# Patient Record
Sex: Female | Born: 1985 | Race: White | Hispanic: No | Marital: Single | Smoking: Never smoker
Health system: Southern US, Community
[De-identification: ages and names within clinical notes are randomized; demographics above are authoritative.]

## PROBLEM LIST (undated history)

## (undated) DIAGNOSIS — T7421XA Adult sexual abuse, confirmed, initial encounter: Secondary | ICD-10-CM

## (undated) DIAGNOSIS — F419 Anxiety disorder, unspecified: Secondary | ICD-10-CM

## (undated) HISTORY — DX: Adult sexual abuse, confirmed, initial encounter: T74.21XA

## (undated) HISTORY — PX: WISDOM TOOTH EXTRACTION: SHX21

## (undated) HISTORY — DX: Anxiety disorder, unspecified: F41.9

---

## 2005-02-26 ENCOUNTER — Emergency Department: Payer: Self-pay | Admitting: Emergency Medicine

## 2006-10-20 ENCOUNTER — Ambulatory Visit: Payer: Self-pay | Admitting: Internal Medicine

## 2015-04-29 ENCOUNTER — Encounter: Payer: Self-pay | Admitting: Emergency Medicine

## 2015-04-29 ENCOUNTER — Emergency Department
Admission: EM | Admit: 2015-04-29 | Discharge: 2015-04-29 | Disposition: A | Discharge status: Home / Self Care | Payer: BC Managed Care – PPO | Attending: Emergency Medicine | Admitting: Emergency Medicine

## 2015-04-29 ENCOUNTER — Emergency Department: Payer: BC Managed Care – PPO

## 2015-04-29 DIAGNOSIS — R1032 Left lower quadrant pain: Secondary | ICD-10-CM | POA: Diagnosis present

## 2015-04-29 DIAGNOSIS — R109 Unspecified abdominal pain: Secondary | ICD-10-CM

## 2015-04-29 LAB — COMPREHENSIVE METABOLIC PANEL
ALBUMIN: 4.3 g/dL (ref 3.5–5.0)
ALT: 20 U/L (ref 14–54)
AST: 23 U/L (ref 15–41)
Alkaline Phosphatase: 64 U/L (ref 38–126)
Anion gap: 10 (ref 5–15)
BUN: 9 mg/dL (ref 6–20)
CHLORIDE: 102 mmol/L (ref 101–111)
CO2: 23 mmol/L (ref 22–32)
Calcium: 9.2 mg/dL (ref 8.9–10.3)
Creatinine, Ser: 0.75 mg/dL (ref 0.44–1.00)
GFR calc Af Amer: >60 mL/min (ref >60)
GFR calc non Af Amer: >60 mL/min (ref >60)
GLUCOSE: 95 mg/dL (ref 65–99)
POTASSIUM: 3.9 mmol/L (ref 3.5–5.1)
Sodium: 135 mmol/L (ref 135–145)
Total Bilirubin: 0.6 mg/dL (ref 0.3–1.2)
Total Protein: 8.2 g/dL — ABNORMAL HIGH (ref 6.5–8.1)

## 2015-04-29 LAB — URINALYSIS COMPLETE WITH MICROSCOPIC (ARMC ONLY)
Bilirubin Urine: NEGATIVE
Glucose, UA: NEGATIVE mg/dL
Hgb urine dipstick: NEGATIVE
Leukocytes, UA: NEGATIVE
Nitrite: NEGATIVE
Protein, ur: NEGATIVE mg/dL
Specific Gravity, Urine: 1.017 (ref 1.005–1.030)
pH: 6 (ref 5.0–8.0)

## 2015-04-29 LAB — CBC
HEMATOCRIT: 40.8 % (ref 35.0–47.0)
Hemoglobin: 14.1 g/dL (ref 12.0–16.0)
MCH: 28.4 pg (ref 26.0–34.0)
MCHC: 34.5 g/dL (ref 32.0–36.0)
MCV: 82.3 fL (ref 80.0–100.0)
Platelets: 352 10*3/uL (ref 150–440)
RBC: 4.96 MIL/uL (ref 3.80–5.20)
RDW: 12.6 % (ref 11.5–14.5)
WBC: 12.2 10*3/uL — ABNORMAL HIGH (ref 3.6–11.0)

## 2015-04-29 LAB — LIPASE, BLOOD: Lipase: 17 U/L (ref 11–51)

## 2015-04-29 LAB — POCT PREGNANCY, URINE: Preg Test, Ur: NEGATIVE

## 2015-04-29 MED ORDER — DICYCLOMINE HCL 20 MG PO TABS: 20.0000 mg | ORAL_TABLET | Freq: Three times a day (TID) | ORAL | Status: DC | PRN

## 2015-04-29 NOTE — Discharge Instructions (Signed)
Please seek medical attention for any high fevers, chest pain, shortness of breath, change in behavior, persistent vomiting, bloody stool or any other new or concerning symptoms. ° ° °Abdominal Pain, Adult °Many things can cause belly (abdominal) pain. Most times, the belly pain is not dangerous. Many cases of belly pain can be watched and treated at home. °HOME CARE  °· Do not take medicines that help you go poop (laxatives) unless told to by your doctor. °· Only take medicine as told by your doctor. °· Eat or drink as told by your doctor. Your doctor will tell you if you should be on a special diet. °GET HELP IF: °· You do not know what is causing your belly pain. °· You have belly pain while you are sick to your stomach (nauseous) or have runny poop (diarrhea). °· You have pain while you pee or poop. °· Your belly pain wakes you up at night. °· You have belly pain that gets worse or better when you eat. °· You have belly pain that gets worse when you eat fatty foods. °· You have a fever. °GET HELP RIGHT AWAY IF:  °· The pain does not go away within 2 hours. °· You keep throwing up (vomiting). °· The pain changes and is only in the right or left part of the belly. °· You have bloody or tarry looking poop. °MAKE SURE YOU:  °· Understand these instructions. °· Will watch your condition. °· Will get help right away if you are not doing well or get worse. °  °This information is not intended to replace advice given to you by your health care provider. Make sure you discuss any questions you have with your health care provider. °  °Document Released: 06/29/2007 Document Revised: 01/31/2014 Document Reviewed: 09/19/2012 °Elsevier Interactive Patient Education ©2016 [employer] ° °

## 2015-04-29 NOTE — ED Notes (Signed)
Pt from home with lower left quadrant pain. States she was seen at walk-in clinic yesterday and was told she had constipation. They informed her this morning that she had a UTI and they prescribed macrobid for it. Pt states she has had nausea and vomiting since yesterday. Pt is tearful in triage.

## 2015-04-29 NOTE — ED Provider Notes (Signed)
**Note Karen-Identified via Obfuscation** [facility] Emergency Department Provider Note    ____________________________________________  Time seen: ~1640  I have reviewed the triage vital signs and the nursing notes.   HISTORY  Chief Complaint Abdominal Pain   History limited by: Not Limited   HPI Karen Mata is a 30 y.o. female who presents to the emergency department today because of concerns for abdominal pain. She states this been going on for the past 2-3 days. It is located in the left side of her abdomen. She describes it as cramping. It does become severe. She states she was seen at urgent care yesterday and was told she had a UTI. She did start on Macrobid today. She has had some nausea. Has had some vomiting. No bloody stool. No measured fevers. 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    History reviewed. No pertinent past medical history.  There are no active problems to display for this patient.   History reviewed. No pertinent past surgical history.  No current outpatient prescriptions on file.  Allergies Review of patient's allergies indicates not on file.  History reviewed. No pertinent family history.  Social History Social History  Substance Use Topics  . Smoking status: Never Smoker   . Smokeless tobacco: None  . Alcohol Use: Yes     Comment: occasional    Review of Systems  Constitutional: Negative for fever. Cardiovascular: Negative for chest pain. Respiratory: Negative for shortness of breath. Gastrointestinal: Positive for left sided abdominal pain.  Neurological: Negative for headaches, focal weakness or numbness.  10-point ROS otherwise negative.  ____________________________________________   PHYSICAL EXAM:  VITAL SIGNS: ED Triage Vitals  Enc Vitals Group     BP 04/29/15 1410 129/109 mmHg     Pulse Rate 04/29/15 1410 106     Resp 04/29/15 1410 20     Temp 04/29/15 1410 98.2 F (36.8 C)     Temp Source  04/29/15 1410 Oral     SpO2 04/29/15 1410 97 %     Weight 04/29/15 1410 280 lb (127.007 kg)     Height 04/29/15 1410  (1.651 m)     Head Cir --      Peak Flow --      Pain Score 04/29/15 1411 8   Constitutional: Alert and oriented. Well appearing and in no distress. Eyes: Conjunctivae are normal. PERRL. Normal extraocular movements. ENT   Head: Normocephalic and atraumatic.   Nose: No congestion/rhinnorhea.   Mouth/Throat: Mucous membranes are moist.   Neck: No stridor. Hematological/Lymphatic/Immunilogical: No cervical lymphadenopathy. Cardiovascular: Normal rate, regular rhythm.  No murmurs, rubs, or gallops. Respiratory: Normal respiratory effort without tachypnea nor retractions. Breath sounds are clear and equal bilaterally. No wheezes/rales/rhonchi. Gastrointestinal: Soft and nontender. No distention.  Genitourinary: Deferred Musculoskeletal: Normal range of motion in all extremities. No joint effusions.  No lower extremity tenderness nor edema. Neurologic:  Normal speech and language. No gross focal neurologic deficits are appreciated.  Skin:  Skin is warm, dry and intact. No rash noted. Psychiatric: Mood and affect are normal. Speech and behavior are normal. Patient exhibits appropriate insight and judgment.  ____________________________________________    LABS (pertinent positives/negatives)  Labs Reviewed  COMPREHENSIVE METABOLIC PANEL - Abnormal; Notable for the following:    Total Protein 8.2 (*)    All other components within normal limits  CBC - Abnormal; Notable for the following:    WBC 12.2 (*)    All other components within normal  limits  URINALYSIS COMPLETEWITH MICROSCOPIC ([facility] ONLY) - Abnormal; Notable for the following:    Color, Urine YELLOW (*)    APPearance CLEAR (*)    Ketones, ur 1+ (*)    Bacteria, UA RARE (*)    Squamous Epithelial / LPF 0-5 (*)    All other components within normal limits  LIPASE, BLOOD  POCT PREGNANCY,  URINE     ____________________________________________   EKG  None  ____________________________________________    RADIOLOGY  Abd x-ray IMPRESSION: No evidence suggest significant constipation. Possible colon wall thickening. Consider CT abdomen/pelvis.  ____________________________________________   PROCEDURES  Procedure(s) performed: None  Critical Care performed: No  ____________________________________________   INITIAL IMPRESSION / ASSESSMENT AND PLAN / ED COURSE  Pertinent labs & imaging results that were available during my care of the patient were reviewed by me and considered in my medical decision making (see chart for details).  Patient presented to the emergency department today because of concerns for left-sided abdominal pain. Abdominal exam is benign. Soft. Abdominal x-ray shows potential for some colon wall thickening. Think likely patient suffering from a viral gastroenteritis. At this point given lack of fever or significant leukocytosis or pain will defer CT scan. I did discuss return precautions with the patient. Will discharge with Bentyl prescription to help with abdominal cramping.  ____________________________________________   FINAL CLINICAL IMPRESSION(S) / ED DIAGNOSES  Final diagnoses:  Left sided abdominal pain     Phineas SemenGraydon Klaira Pesci, MD 04/29/15 1753

## 2015-07-22 LAB — HM PAP SMEAR: HM Pap smear: NEGATIVE

## 2017-07-31 IMAGING — CR DG ABDOMEN 2V
3 series · 3 of 3 positions shown · non-contrast
Comparison: None.

CLINICAL DATA: Left lower quadrant pain and constipation with
vomiting

EXAM:
ABDOMEN - 2 VIEW

[abdomen erect]
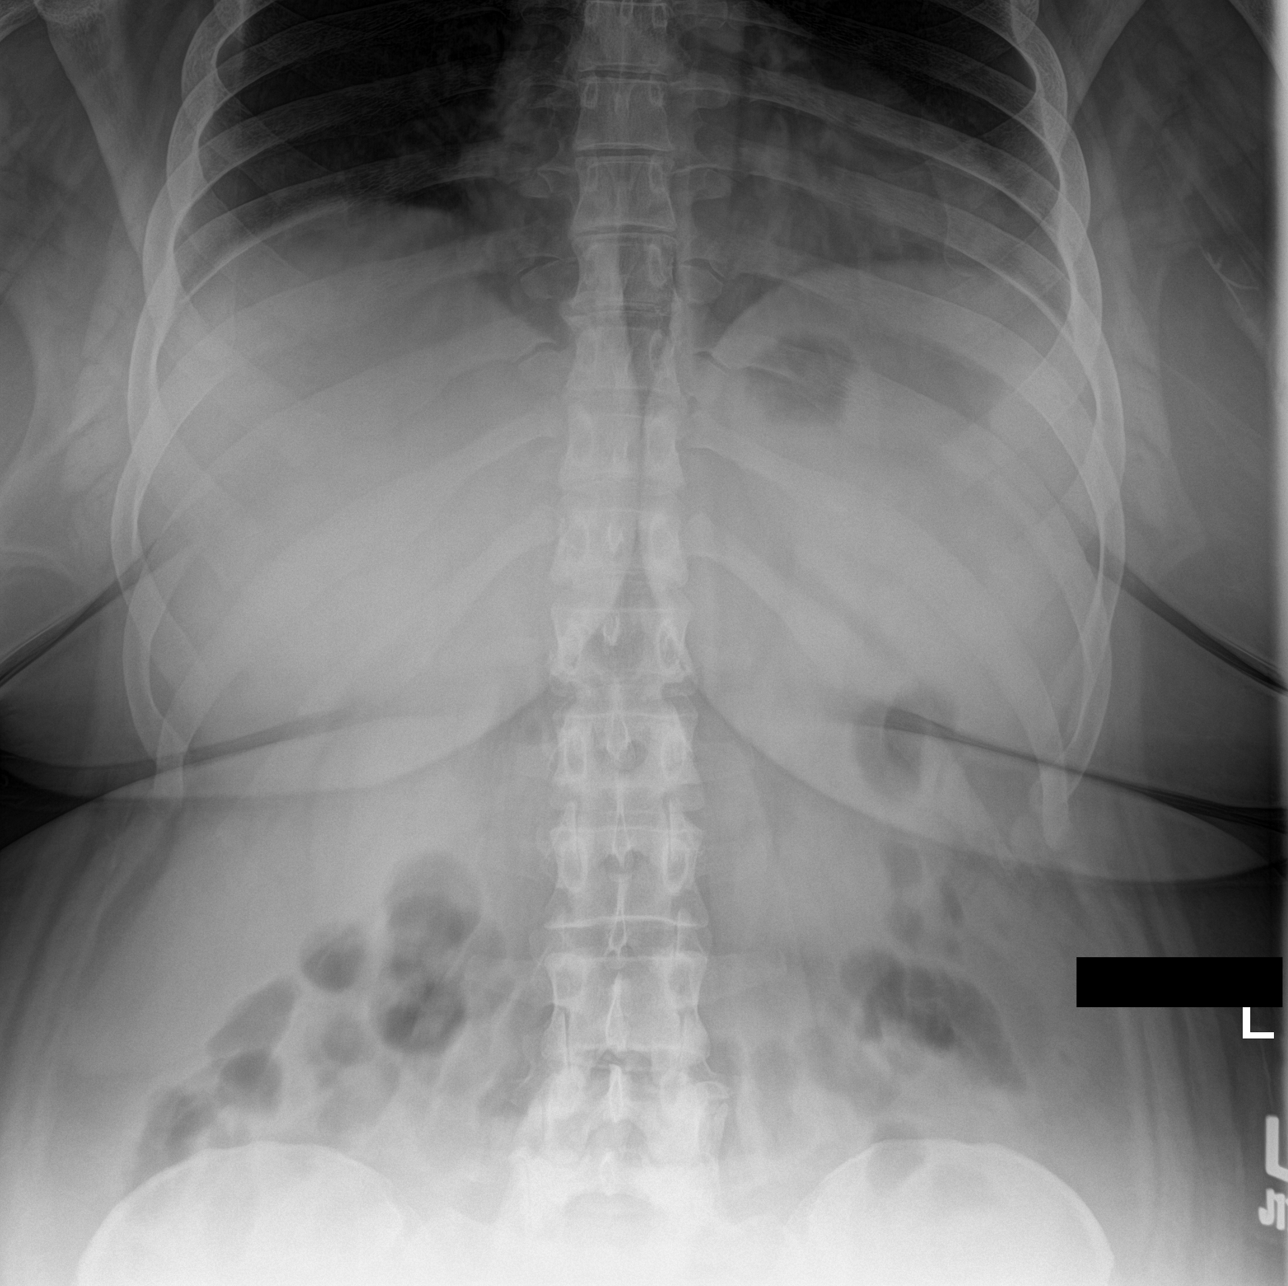

[abdomen supine (1 of 2)]
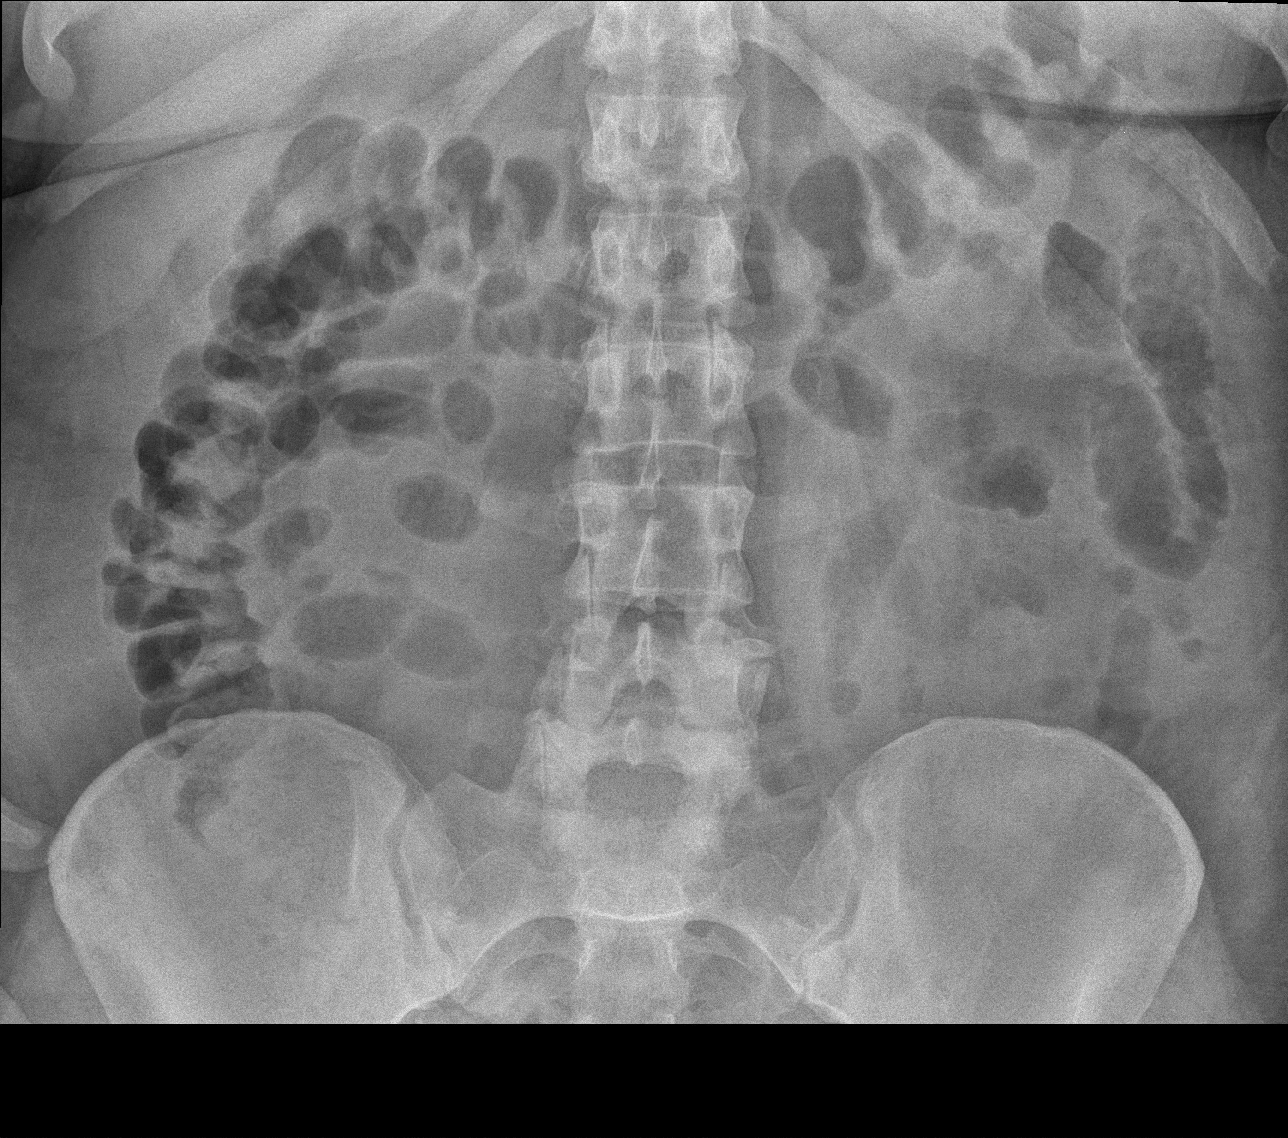

[abdomen supine (2 of 2)]
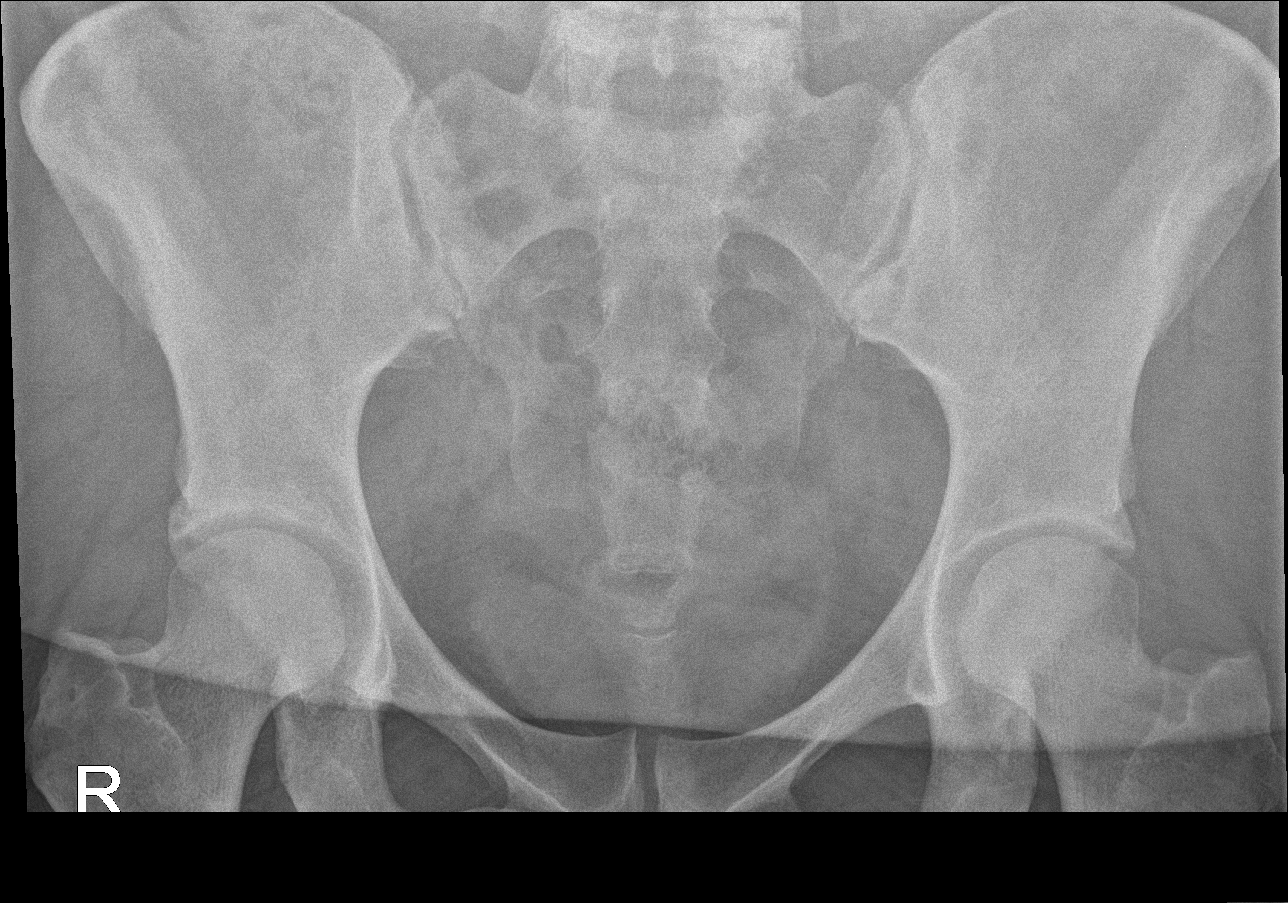

[3 of 3 positions shown; findings below may reference images not displayed]

FINDINGS: No free air. No abnormally dilated loops of bowel. Mild fecal
retention in the proximal colon. Mildly prominent haustration of the
ascending and transverse colon.
IMPRESSION: No evidence suggest significant constipation. Possible colon wall
thickening. Consider CT abdomen/pelvis.

## 2018-07-04 ENCOUNTER — Other Ambulatory Visit: Payer: Self-pay

## 2018-07-04 ENCOUNTER — Ambulatory Visit: Payer: BC Managed Care – PPO | Admitting: Obstetrics and Gynecology

## 2018-07-04 ENCOUNTER — Encounter: Payer: Self-pay | Admitting: Obstetrics and Gynecology

## 2018-07-04 VITALS — BP 126/82 | HR 112 | Ht 65.0 in | Wt 337.0 lb

## 2018-07-04 DIAGNOSIS — B9689 Other specified bacterial agents as the cause of diseases classified elsewhere: Secondary | ICD-10-CM

## 2018-07-04 DIAGNOSIS — Z30011 Encounter for initial prescription of contraceptive pills: Secondary | ICD-10-CM | POA: Diagnosis not present

## 2018-07-04 DIAGNOSIS — R300 Dysuria: Secondary | ICD-10-CM | POA: Diagnosis not present

## 2018-07-04 DIAGNOSIS — N760 Acute vaginitis: Secondary | ICD-10-CM | POA: Diagnosis not present

## 2018-07-04 DIAGNOSIS — N939 Abnormal uterine and vaginal bleeding, unspecified: Secondary | ICD-10-CM

## 2018-07-04 LAB — POCT WET PREP WITH KOH
Clue Cells Wet Prep HPF POC: POSITIVE
KOH Prep POC: POSITIVE — AB
Trichomonas, UA: NEGATIVE
Yeast Wet Prep HPF POC: NEGATIVE

## 2018-07-04 MED ORDER — NORETHINDRONE ACET-ETHINYL EST 1-20 MG-MCG PO TABS: 1.0000 | ORAL_TABLET | Freq: Every day | ORAL | 3 refills | Status: DC

## 2018-07-04 MED ORDER — METRONIDAZOLE 500 MG PO TABS: 500.0000 mg | ORAL_TABLET | Freq: Two times a day (BID) | ORAL | 0 refills | Status: AC
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
## 2018-07-04 NOTE — Patient Instructions (Signed)
I value your feedback and entrusting us with your care. If you get a [facility] patient survey, I would appreciate you taking the time to let us know about your experience today. Thank you! 

## 2018-07-23 ENCOUNTER — Telehealth: Payer: Self-pay

## 2018-07-23 NOTE — Telephone Encounter (Signed)
Pt calling; still having bleeding - little to a lot depending on the day.  Does she need to be seen again or what to do?  [phone]

## 2018-07-23 NOTE — Telephone Encounter (Signed)
Did she start her OCPs less than a month ago (after being seen)? Irreg bleeding is normal with BC start. If so, wait till starts 2nd pack. If still bleeding frequently, pt needs u/s and labs. If bleeding improves, then need to give it 3 months for cycle control. RN to discuss with pt.

## 2018-07-23 NOTE — Telephone Encounter (Signed)
Please advise 

## 2018-07-23 NOTE — Telephone Encounter (Signed)
Pt started OCP's Sunday after she saw you. Says blood clots are pretty big. PT aware of your notes.

## 2018-10-25 ENCOUNTER — Telehealth: Payer: Self-pay

## 2018-10-25 NOTE — Telephone Encounter (Signed)
Pt calling for rx for bc she was given 78m ago; it is working for her.  [facility].  [phone]

## 2018-10-26 NOTE — Telephone Encounter (Signed)
Glad she is doing well with pills. Rx eRxd for 1 yr at 6/20 appt so she should have RF. THx

## 2018-10-26 NOTE — Telephone Encounter (Signed)
Pt aware, will let us know if she has any issues.

## 2018-10-29 ENCOUNTER — Other Ambulatory Visit: Payer: Self-pay

## 2018-10-29 ENCOUNTER — Other Ambulatory Visit: Payer: Self-pay | Admitting: Obstetrics and Gynecology

## 2018-10-29 DIAGNOSIS — Z30011 Encounter for initial prescription of contraceptive pills: Secondary | ICD-10-CM

## 2018-10-29 DIAGNOSIS — N939 Abnormal uterine and vaginal bleeding, unspecified: Secondary | ICD-10-CM

## 2018-10-29 MED ORDER — NORETHINDRONE ACET-ETHINYL EST 1-20 MG-MCG PO TABS: 1.0000 | ORAL_TABLET | Freq: Every day | ORAL | 3 refills | Status: DC

## 2018-10-29 NOTE — Telephone Encounter (Signed)
BC RF sent, pt aware. 

## 2018-10-29 NOTE — Telephone Encounter (Signed)
Patient is calling saying her prescription for Birth Control is not at her Pharmacy. Currently out of prescription. Please contact [facility] in [place]. Please contact patient.

## 2019-01-12 ENCOUNTER — Other Ambulatory Visit: Payer: Self-pay | Admitting: Obstetrics and Gynecology

## 2019-01-12 DIAGNOSIS — N939 Abnormal uterine and vaginal bleeding, unspecified: Secondary | ICD-10-CM

## 2019-01-12 DIAGNOSIS — Z30011 Encounter for initial prescription of contraceptive pills: Secondary | ICD-10-CM

## 2019-05-11 ENCOUNTER — Other Ambulatory Visit: Payer: Self-pay | Admitting: Obstetrics and Gynecology

## 2019-05-11 DIAGNOSIS — Z30011 Encounter for initial prescription of contraceptive pills: Secondary | ICD-10-CM

## 2019-05-11 DIAGNOSIS — N939 Abnormal uterine and vaginal bleeding, unspecified: Secondary | ICD-10-CM

## 2019-09-15 ENCOUNTER — Other Ambulatory Visit: Payer: Self-pay | Admitting: Obstetrics and Gynecology

## 2019-09-15 DIAGNOSIS — Z30011 Encounter for initial prescription of contraceptive pills: Secondary | ICD-10-CM

## 2019-09-15 DIAGNOSIS — N939 Abnormal uterine and vaginal bleeding, unspecified: Secondary | ICD-10-CM

## 2019-09-19 ENCOUNTER — Telehealth: Payer: Self-pay

## 2019-09-19 NOTE — Telephone Encounter (Signed)
ok 

## 2019-09-19 NOTE — Telephone Encounter (Signed)
Copied from CRM [id number]. Topic: Appointment Scheduling - New Patient >> Sep 19, 2019 10:59 AM Wyonia Hough E wrote: Reason for CRM: Pts father(James Gulledge) is a pt of Dr. Wonda Olds He asked if Dr. Sullivan Lone will approve of taking his daughter as a new Pt/ please advise and call the daughter or Mr Yott  New patient would like to be scheduled for your office. Provider: Sullivan Lone  Date of Appointment:   Route to department's PEC pool.

## 2019-09-20 ENCOUNTER — Other Ambulatory Visit: Payer: Self-pay | Admitting: Obstetrics and Gynecology

## 2019-09-20 DIAGNOSIS — N939 Abnormal uterine and vaginal bleeding, unspecified: Secondary | ICD-10-CM

## 2019-09-20 DIAGNOSIS — Z30011 Encounter for initial prescription of contraceptive pills: Secondary | ICD-10-CM

## 2019-09-20 NOTE — Telephone Encounter (Signed)
Patient scheduled for September 14th at 3:20 PM.

## 2019-09-20 NOTE — Telephone Encounter (Signed)
Pt called back, and advised OK per Dr Sullivan Lone to be new pt.  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  Pt had covid a few weeks ago, and wanted to follow up with a dr for ongoing symptoms, ie: anxiety

## 2019-09-20 NOTE — Telephone Encounter (Signed)
Called to advise patient that Dr. Sullivan Lone says that he will take her as a new patient. LVMTCB to schedule for possibly September 14th.

## 2019-09-20 NOTE — Telephone Encounter (Signed)
Patient requesting refill on bc, completely out, annual scheduled 10/6 with [facility].  [facility].

## 2019-10-03 ENCOUNTER — Telehealth: Payer: Self-pay

## 2019-10-03 NOTE — Telephone Encounter (Signed)
According to my schedule she has an appointment next week with me on the 14th.

## 2019-10-03 NOTE — Telephone Encounter (Signed)
Copied from CRM #[id number]. Topic: Appointment Scheduling - New Patient °>> Sep 19, 2019 10:59 AM Johnson, Chaz E wrote: °Reason for CRM: Pts father(James Fero) is a pt of Dr. Gilberts/ He asked if Dr. Gilbert will approve of taking his daughter as a new Pt/ please advise and call the daughter or Mr Maddalena  °New patient would like to be scheduled for your office. °Provider: Gilbert  °Date of Appointment:  ° °Route to department's PEC pool. °
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
## 2019-10-08 ENCOUNTER — Ambulatory Visit: Payer: BC Managed Care – PPO | Admitting: Family Medicine

## 2019-10-08 ENCOUNTER — Other Ambulatory Visit: Payer: Self-pay

## 2019-10-08 ENCOUNTER — Encounter: Payer: Self-pay | Admitting: Family Medicine

## 2019-10-08 VITALS — BP 113/74 | HR 94 | Temp 98.1°F | Resp 16 | Ht 65.0 in | Wt 251.0 lb

## 2019-10-08 DIAGNOSIS — L309 Dermatitis, unspecified: Secondary | ICD-10-CM | POA: Diagnosis not present

## 2019-10-08 DIAGNOSIS — R079 Chest pain, unspecified: Secondary | ICD-10-CM | POA: Diagnosis not present

## 2019-10-08 DIAGNOSIS — Z6841 Body Mass Index (BMI) 40.0 and over, adult: Secondary | ICD-10-CM

## 2019-10-08 DIAGNOSIS — Z0000 Encounter for general adult medical examination without abnormal findings: Secondary | ICD-10-CM | POA: Diagnosis not present

## 2019-10-08 MED ORDER — TRIAMCINOLONE ACETONIDE 0.1 % EX CREA: 1.0000 "application " | TOPICAL_CREAM | Freq: Every day | CUTANEOUS | 2 refills | Status: DC | PRN

## 2019-10-09 LAB — CBC WITH DIFFERENTIAL/PLATELET
Basophils Absolute: 0 10*3/uL (ref 0.0–0.2)
Basos: 0 %
EOS (ABSOLUTE): 0.2 10*3/uL (ref 0.0–0.4)
Eos: 1 %
Hematocrit: 36.8 % (ref 34.0–46.6)
Hemoglobin: 12.5 g/dL (ref 11.1–15.9)
Immature Grans (Abs): 0.1 10*3/uL (ref 0.0–0.1)
Immature Granulocytes: 1 %
Lymphocytes Absolute: 3.3 10*3/uL — ABNORMAL HIGH (ref 0.7–3.1)
Lymphs: 24 %
MCH: 27.4 pg (ref 26.6–33.0)
MCHC: 34 g/dL (ref 31.5–35.7)
MCV: 81 fL (ref 79–97)
Monocytes Absolute: 0.7 10*3/uL (ref 0.1–0.9)
Monocytes: 5 %
Neutrophils Absolute: 9.7 10*3/uL — ABNORMAL HIGH (ref 1.4–7.0)
Neutrophils: 69 %
Platelets: 419 10*3/uL (ref 150–450)
RBC: 4.56 x10E6/uL (ref 3.77–5.28)
RDW: 15.6 % — ABNORMAL HIGH (ref 11.7–15.4)
WBC: 13.9 10*3/uL — ABNORMAL HIGH (ref 3.4–10.8)

## 2019-10-09 LAB — COMPREHENSIVE METABOLIC PANEL
ALT: 27 IU/L (ref 0–32)
AST: 21 IU/L (ref 0–40)
Albumin/Globulin Ratio: 1.3 (ref 1.2–2.2)
Albumin: 4.4 g/dL (ref 3.8–4.8)
Alkaline Phosphatase: 119 IU/L (ref 44–121)
BUN/Creatinine Ratio: 18 (ref 9–23)
BUN: 14 mg/dL (ref 6–20)
Bilirubin Total: 0.5 mg/dL (ref 0.0–1.2)
CO2: 21 mmol/L (ref 20–29)
Calcium: 9.5 mg/dL (ref 8.7–10.2)
Chloride: 98 mmol/L (ref 96–106)
Creatinine, Ser: 0.78 mg/dL (ref 0.57–1.00)
GFR calc Af Amer: 113 mL/min/{1.73_m2} (ref >59)
GFR calc non Af Amer: 98 mL/min/{1.73_m2} (ref >59)
Globulin, Total: 3.3 g/dL (ref 1.5–4.5)
Glucose: 80 mg/dL (ref 65–99)
Potassium: 4.7 mmol/L (ref 3.5–5.2)
Sodium: 136 mmol/L (ref 134–144)
Total Protein: 7.7 g/dL (ref 6.0–8.5)

## 2019-10-09 LAB — LIPID PANEL
Chol/HDL Ratio: 4.7 ratio — ABNORMAL HIGH (ref 0.0–4.4)
Cholesterol, Total: 185 mg/dL (ref 100–199)
HDL: 39 mg/dL — ABNORMAL LOW (ref >39)
LDL Chol Calc (NIH): 109 mg/dL — ABNORMAL HIGH (ref 0–99)
Triglycerides: 215 mg/dL — ABNORMAL HIGH (ref 0–149)
VLDL Cholesterol Cal: 37 mg/dL (ref 5–40)

## 2019-10-09 LAB — TSH: TSH: 4.26 u[IU]/mL (ref 0.450–4.500)

## 2019-10-11 ENCOUNTER — Telehealth: Payer: Self-pay

## 2019-10-11 NOTE — Telephone Encounter (Signed)
-----   Message from Maple Hudson., MD sent at 10/10/2019  4:31 PM EDT ----- Labs in normal range.

## 2019-10-11 NOTE — Telephone Encounter (Signed)
Patient advised of lab results via mychart and has read the providers comments.  

## 2019-10-30 ENCOUNTER — Ambulatory Visit: Payer: BC Managed Care – PPO | Admitting: Obstetrics and Gynecology

## 2019-12-04 ENCOUNTER — Ambulatory Visit (INDEPENDENT_AMBULATORY_CARE_PROVIDER_SITE_OTHER): Payer: BC Managed Care – PPO | Admitting: Obstetrics and Gynecology

## 2019-12-04 ENCOUNTER — Other Ambulatory Visit: Payer: Self-pay

## 2019-12-04 ENCOUNTER — Encounter: Payer: Self-pay | Admitting: Obstetrics and Gynecology

## 2019-12-04 VITALS — BP 118/74 | Ht 65.0 in | Wt 271.0 lb

## 2019-12-04 DIAGNOSIS — N644 Mastodynia: Secondary | ICD-10-CM | POA: Diagnosis not present

## 2019-12-04 DIAGNOSIS — Z3041 Encounter for surveillance of contraceptive pills: Secondary | ICD-10-CM | POA: Diagnosis not present

## 2019-12-04 MED ORDER — NORETHINDRONE ACET-ETHINYL EST 1-20 MG-MCG PO TABS
1.0000 | ORAL_TABLET | Freq: Every day | ORAL | 3 refills | Status: DC
Start: 2019-12-04 — End: 2022-09-12
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
## 2019-12-04 NOTE — Patient Instructions (Signed)
I value your feedback and entrusting us with your care. If you get a [facility] patient survey, I would appreciate you taking the time to let us know about your experience today. Thank you!  As of January 03, 2019, your lab results will be released to your MyChart immediately, before I even have a chance to see them. Please give me time to review them and contact you if there are any abnormalities. Thank you for your patience.  

## 2020-01-13 DIAGNOSIS — E669 Obesity, unspecified: Secondary | ICD-10-CM | POA: Insufficient documentation

## 2020-01-13 NOTE — Progress Notes (Signed)
Established patient visit   Patient: Karen Mata   DOB: [birth year]   34 y.o. Female  MRN: [id number] Visit Date: 01/14/2020  Today's healthcare provider: Megan Mans, MD   Chief Complaint  Patient presents with   Anxiety   Weight Check   Cyst   Subjective    HPI  Patient comes in today for follow-up.  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  She has been stressed eating and has gained 40 pounds this year.  She had lost weight with an [place] diet earlier. She does have a new problem, a growth on her left wrist.  It is not tender. Anxiety, Follow-up  She was last seen for anxiety 3 months ago. Changes made at last visit include; not currently on a medication.   She reports good compliance with treatment. She reports good tolerance of treatment. She is not having side effects.   She feels her anxiety is moderate and Unchanged since last visit.  Symptoms: No chest pain No difficulty concentrating  No dizziness No fatigue  Yes feelings of losing control Yes insomnia  Yes irritable No palpitations  No panic attacks No racing thoughts  No shortness of breath No sweating  No tremors/shakes    GAD-7 Results GAD-7 Generalized Anxiety Disorder Screening Tool 01/14/2020 10/08/2019  1. Feeling Nervous, Anxious, or on Edge 1 1  2. Not Being Able to Stop or Control Worrying 1 1  3. Worrying Too Much About Different Things 1 1  4. Trouble Relaxing 1 0  5. Being So Restless it's Hard To Sit Still 0 0  6. Becoming Easily Annoyed or Irritable 1 1  7. Feeling Afraid As If Something Awful Might Happen 1 1  Total GAD-7 Score 6 5  Difficulty At Work, Home, or Getting  Along With Others? Somewhat difficult Not difficult at all    PHQ-9 Scores PHQ9 SCORE ONLY 01/14/2020 10/08/2019  PHQ-9 Total Score 3 0    Adiposity From 10/08/2019-Pt working on weight loss. Wt Readings from Last 3 Encounters:  01/14/20 292 lb (132.5 kg)  12/04/19 271 lb (122.9 kg)   10/08/19 251 lb (113.9 kg)         Medications: Outpatient Medications Prior to Visit  Medication Sig   norethindrone-ethinyl estradiol (MICROGESTIN) 1-20 MG-MCG tablet Take 1 tablet by mouth daily.   triamcinolone cream (KENALOG) 0.1 % Apply 1 application topically daily as needed.   dicyclomine (BENTYL) 20 MG tablet Take 1 tablet (20 mg total) by mouth 3 (three) times daily as needed (abdominal pain). (Patient not taking: No sig reported)   No facility-administered medications prior to visit.    Review of Systems  Constitutional: Negative for appetite change, chills, fatigue and fever.  Respiratory: Negative for chest tightness and shortness of breath.   Cardiovascular: Negative for chest pain and palpitations.  Gastrointestinal: Negative for abdominal pain, nausea and vomiting.  Neurological: Negative for dizziness and weakness.       Objective    BP 139/88    Pulse 89    Temp 98.6 F (37 C)    Resp 16    Ht 5\' 4"  (1.626 m)    Wt 292 lb (132.5 kg)    BMI 50.12 kg/m  BP Readings from Last 3 Encounters:  01/14/20 139/88  12/04/19 118/74  10/08/19 113/74   Wt Readings from Last 3 Encounters:  01/14/20 292 lb (132.5 kg)  12/04/19 271 lb (122.9 kg)  10/08/19 251  lb (113.9 kg)      Physical Exam Vitals reviewed.  Constitutional:      Appearance: She is obese.  HENT:     Head: Normocephalic and atraumatic.     Right Ear: Tympanic membrane and external ear normal.     Left Ear: Tympanic membrane and external ear normal.     Nose: Nose normal.     Mouth/Throat:     Pharynx: Oropharynx is clear.  Eyes:     General: No scleral icterus.    Conjunctiva/sclera: Conjunctivae normal.  Cardiovascular:     Rate and Rhythm: Normal rate and regular rhythm.     Pulses: Normal pulses.     Heart sounds: Normal heart sounds.  Pulmonary:     Effort: Pulmonary effort is normal.     Breath sounds: Normal breath sounds.  Abdominal:     Palpations: Abdomen is soft.   Musculoskeletal:     Comments: There appears to be a small ganglion cyst over the left radial artery area of the left wrist.  Lymphadenopathy:     Cervical: No cervical adenopathy.  Skin:    General: Skin is warm and dry.  Neurological:     General: No focal deficit present.     Mental Status: She is alert and oriented to person, place, and time.  Psychiatric:        Mood and Affect: Mood normal.        Behavior: Behavior normal.        Thought Content: Thought content normal.        Judgment: Judgment normal.       No results found for any visits on 01/14/20.  Assessment & Plan     1. Class 3 severe obesity due to excess calories without serious comorbidity with body mass index (BMI) of 50.0 to 59.9 in adult [place]) Patient has gained 40 pounds in 3 months.  Diet and exercise stressed. Patient has declined flu vaccine and Covid vaccines.  Encouraged her to continue getting vaccine for Covid  2. Ganglion of left wrist Follow clinically.  May need surgery referral/hand surgeon.   No follow-ups on file.         Adelai Achey Wendelyn Breslow, MD  [facility] [phone] (phone) [phone] (fax)  [facility]

## 2020-01-14 ENCOUNTER — Other Ambulatory Visit: Payer: Self-pay

## 2020-01-14 ENCOUNTER — Encounter: Payer: Self-pay | Admitting: Family Medicine

## 2020-01-14 ENCOUNTER — Ambulatory Visit: Payer: BC Managed Care – PPO | Admitting: Family Medicine

## 2020-01-14 VITALS — BP 139/88 | HR 89 | Temp 98.6°F | Resp 16 | Ht 64.0 in | Wt 292.0 lb

## 2020-01-14 DIAGNOSIS — M67432 Ganglion, left wrist: Secondary | ICD-10-CM

## 2020-01-14 DIAGNOSIS — Z6841 Body Mass Index (BMI) 40.0 and over, adult: Secondary | ICD-10-CM

## 2020-01-14 DIAGNOSIS — E66813 Obesity, class 3: Secondary | ICD-10-CM

## 2020-01-20 ENCOUNTER — Ambulatory Visit: Payer: BC Managed Care – PPO | Admitting: Family Medicine

## 2020-04-20 ENCOUNTER — Ambulatory Visit: Payer: Self-pay | Admitting: Family Medicine

## 2022-08-25 DIAGNOSIS — M545 Low back pain, unspecified: Secondary | ICD-10-CM | POA: Insufficient documentation

## 2022-08-26 ENCOUNTER — Emergency Department: Payer: 59

## 2022-08-26 ENCOUNTER — Emergency Department
Admission: EM | Admit: 2022-08-26 | Discharge: 2022-08-26 | Disposition: A | Discharge status: Home / Self Care | Payer: 59 | Attending: Emergency Medicine | Admitting: Emergency Medicine

## 2022-08-26 ENCOUNTER — Other Ambulatory Visit: Payer: Self-pay

## 2022-08-26 DIAGNOSIS — R739 Hyperglycemia, unspecified: Secondary | ICD-10-CM

## 2022-08-26 DIAGNOSIS — N7011 Chronic salpingitis: Secondary | ICD-10-CM

## 2022-08-26 DIAGNOSIS — N83201 Unspecified ovarian cyst, right side: Secondary | ICD-10-CM | POA: Diagnosis not present

## 2022-08-26 DIAGNOSIS — R1032 Left lower quadrant pain: Secondary | ICD-10-CM | POA: Diagnosis present

## 2022-08-26 LAB — URINALYSIS, ROUTINE W REFLEX MICROSCOPIC
Bilirubin Urine: NEGATIVE
Glucose, UA: NEGATIVE mg/dL
Hgb urine dipstick: NEGATIVE
Ketones, ur: NEGATIVE mg/dL
Leukocytes,Ua: NEGATIVE
Nitrite: NEGATIVE
Protein, ur: 30 mg/dL — AB
Specific Gravity, Urine: 1.028 (ref 1.005–1.030)
pH: 5 (ref 5.0–8.0)

## 2022-08-26 LAB — COMPREHENSIVE METABOLIC PANEL
ALT: 29 U/L (ref 0–44)
AST: 36 U/L (ref 15–41)
Albumin: 3.8 g/dL (ref 3.5–5.0)
Alkaline Phosphatase: 126 U/L (ref 38–126)
Anion gap: 12 (ref 5–15)
BUN: 13 mg/dL (ref 6–20)
CO2: 23 mmol/L (ref 22–32)
Calcium: 9.1 mg/dL (ref 8.9–10.3)
Chloride: 97 mmol/L — ABNORMAL LOW (ref 98–111)
Creatinine, Ser: 0.83 mg/dL (ref 0.44–1.00)
GFR, Estimated: >60 mL/min (ref >60)
Glucose, Bld: 207 mg/dL — ABNORMAL HIGH (ref 70–99)
Potassium: 3.8 mmol/L (ref 3.5–5.1)
Sodium: 132 mmol/L — ABNORMAL LOW (ref 135–145)
Total Bilirubin: 0.3 mg/dL (ref 0.3–1.2)
Total Protein: 8.5 g/dL — ABNORMAL HIGH (ref 6.5–8.1)

## 2022-08-26 LAB — LIPASE, BLOOD: Lipase: 41 U/L (ref 11–51)

## 2022-08-26 LAB — CBC
HCT: 43.5 % (ref 36.0–46.0)
Hemoglobin: 13.9 g/dL (ref 12.0–15.0)
MCH: 26.1 pg (ref 26.0–34.0)
MCHC: 32 g/dL (ref 30.0–36.0)
MCV: 81.6 fL (ref 80.0–100.0)
Platelets: 390 10*3/uL (ref 150–400)
RBC: 5.33 MIL/uL — ABNORMAL HIGH (ref 3.87–5.11)
RDW: 14.6 % (ref 11.5–15.5)
WBC: 12.3 10*3/uL — ABNORMAL HIGH (ref 4.0–10.5)
nRBC: 0 % (ref 0.0–0.2)

## 2022-08-26 LAB — PREGNANCY, URINE: Preg Test, Ur: NEGATIVE

## 2022-08-26 MED ORDER — ONDANSETRON HCL 4 MG/2ML IJ SOLN
4.0000 mg | INTRAMUSCULAR | Status: AC
  Administered 2022-08-26: 4 mg via INTRAVENOUS
  Filled 2022-08-26: qty 2

## 2022-08-26 MED ORDER — IOHEXOL 350 MG/ML SOLN
100.0000 mL | Freq: Once | INTRAVENOUS | Status: AC | PRN
  Administered 2022-08-26: 100 mL via INTRAVENOUS

## 2022-08-26 MED ORDER — HYDROCODONE-ACETAMINOPHEN 5-325 MG PO TABS
2.0000 | ORAL_TABLET | Freq: Once | ORAL | Status: AC
  Administered 2022-08-26: 2 via ORAL
  Filled 2022-08-26: qty 2

## 2022-08-26 MED ORDER — SODIUM CHLORIDE 0.9 % IV BOLUS
1000.0000 mL | Freq: Once | INTRAVENOUS | Status: AC
  Administered 2022-08-26: 1000 mL via INTRAVENOUS

## 2022-08-26 NOTE — ED Notes (Signed)
Pt denied being able to provide a urine sample after triage stating "I just peed." Pt provided a labeled urine specimen cup and instructions to return cup when it contains a clean catch urine specimen from pt.

## 2022-08-26 NOTE — Discharge Instructions (Addendum)
Please follow-up closely with OB/GYN  Return to the ER right away if you begin having a fever, severe pain, your symptoms are worsening, you have vaginal discharge or bleeding, or other concerns or symptoms arise.

## 2022-08-26 NOTE — ED Provider Notes (Signed)
[facility] Provider Note    Event Date/Time   First MD Initiated Contact with Patient 08/26/22 1912     (approximate)   History   Abdominal Pain   HPI {Remember to add pertinent medical, surgical, social, and/or OB history to HPI:1} Karen Mata is a 37 y.o. female  ***       Physical Exam   Triage Vital Signs: ED Triage Vitals  Encounter Vitals Group     BP 08/26/22 1613 (!) 138/109     Systolic BP Percentile --      Diastolic BP Percentile --      Pulse Rate 08/26/22 1610 (!) 121     Resp 08/26/22 1610 20     Temp 08/26/22 1610 98.1 F (36.7 C)     Temp Source 08/26/22 1610 Oral     SpO2 08/26/22 1610 97 %     Weight 08/26/22 1611 292 lb 1.8 oz (132.5 kg)     Height 08/26/22 1611 5\' 4"  (1.626 m)     Head Circumference --      Peak Flow --      Pain Score 08/26/22 1611 6     Pain Loc --      Pain Education --      Exclude from Growth Chart --     Most recent vital signs: Vitals:   08/26/22 1900 08/26/22 1930  BP: (!) 143/87 (!) 140/84  Pulse: (!) 109 (!) 108  Resp: 18 18  Temp:    SpO2: 97% 96%    {Only need to document appropriate and relevant physical exam:1} General: Awake, no distress. *** CV:  Good peripheral perfusion. *** Resp:  Normal effort. *** Abd:  No distention. *** Other:  ***   ED Results / Procedures / Treatments   Labs (all labs ordered are listed, but only abnormal results are displayed) Labs Reviewed  COMPREHENSIVE METABOLIC PANEL - Abnormal; Notable for the following components:      Result Value   Sodium 132 (*)    Chloride 97 (*)    Glucose, Bld 207 (*)    Total Protein 8.5 (*)    All other components within normal limits  CBC - Abnormal; Notable for the following components:   WBC 12.3 (*)    RBC 5.33 (*)    All other components within normal limits  LIPASE, BLOOD  URINALYSIS, ROUTINE W REFLEX MICROSCOPIC  POC URINE PREG, ED     EKG  ***   RADIOLOGY *** {USE THE WORD  "INTERPRETED"!! You MUST document your own interpretation of imaging, as well as the fact that you reviewed the radiologist's report!:1}   PROCEDURES:  Critical Care performed: {CriticalCareYesNo:19197::"Yes, see critical care procedure note(s)","No"}  Procedures   MEDICATIONS ORDERED IN ED: Medications - No data to display   IMPRESSION / MDM / ASSESSMENT AND PLAN / ED COURSE  I reviewed the triage vital signs and the nursing notes.                              Differential diagnosis includes, but is not limited to, ***  Patient's presentation is most consistent with {EM COPA:27473}  *** {If the patient is on the monitor, remove the brackets and asterisks on the sentence below and remember to document it as a Procedure as well. Otherwise delete the sentence below:1} {**The patient is on the cardiac monitor to evaluate for evidence of arrhythmia and/or  significant heart rate changes.**} {Remember to include, when applicable, any/all of the following data: independent review of imaging independent review of labs (comment specifically on pertinent positives and negatives) review of specific prior hospitalizations, PCP/specialist notes, etc. discuss meds given and prescribed document any discussion with consultants (including hospitalists) any clinical decision tools you used and why (PECARN, NEXUS, etc.) did you consider admitting the patient? document social determinants of health affecting patient's care (homelessness, inability to follow up in a timely fashion, etc) document any pre-existing conditions increasing risk on current visit (e.g. diabetes and HTN increasing danger of high-risk chest pain/ACS) describes what meds you gave (especially parenteral) and why any other interventions?:1}     FINAL CLINICAL IMPRESSION(S) / ED DIAGNOSES   Final diagnoses:  None     Rx / DC Orders   ED Discharge Orders     None        Note:  This document was prepared using  Dragon voice recognition software and may include unintentional dictation errors.

## 2022-08-26 NOTE — ED Triage Notes (Signed)
Pt here with abd pain since 2 months but getting worse. Pt states the pain starts in her groin and radiates upwards. Pt states pain is constant unless she is laying down and then it gets better. Pt states she has been having some diarrhea. Pt denies fevers.

## 2022-08-27 MED ORDER — HYDROCODONE-ACETAMINOPHEN 5-325 MG PO TABS: 1.0000 | ORAL_TABLET | Freq: Four times a day (QID) | ORAL | 0 refills | Status: AC | PRN

## 2022-09-02 ENCOUNTER — Emergency Department
Admission: EM | Admit: 2022-09-02 | Discharge: 2022-09-02 | Disposition: A | Discharge status: Home / Self Care | Payer: 59 | Source: Home / Self Care | Attending: Student in an Organized Health Care Education/Training Program | Admitting: Student in an Organized Health Care Education/Training Program

## 2022-09-02 ENCOUNTER — Telehealth: Payer: Self-pay

## 2022-09-02 ENCOUNTER — Emergency Department: Payer: 59

## 2022-09-02 ENCOUNTER — Other Ambulatory Visit: Payer: Self-pay

## 2022-09-02 DIAGNOSIS — M79605 Pain in left leg: Secondary | ICD-10-CM | POA: Insufficient documentation

## 2022-09-02 DIAGNOSIS — R109 Unspecified abdominal pain: Secondary | ICD-10-CM | POA: Diagnosis not present

## 2022-09-02 DIAGNOSIS — M79604 Pain in right leg: Secondary | ICD-10-CM | POA: Diagnosis not present

## 2022-09-02 DIAGNOSIS — M545 Low back pain, unspecified: Secondary | ICD-10-CM

## 2022-09-02 DIAGNOSIS — R252 Cramp and spasm: Secondary | ICD-10-CM

## 2022-09-02 LAB — COMPREHENSIVE METABOLIC PANEL
ALT: 35 U/L (ref 0–44)
AST: 40 U/L (ref 15–41)
Albumin: 3.8 g/dL (ref 3.5–5.0)
Alkaline Phosphatase: 93 U/L (ref 38–126)
Anion gap: 11 (ref 5–15)
BUN: 10 mg/dL (ref 6–20)
CO2: 26 mmol/L (ref 22–32)
Calcium: 8.8 mg/dL — ABNORMAL LOW (ref 8.9–10.3)
Chloride: 99 mmol/L (ref 98–111)
Creatinine, Ser: 0.84 mg/dL (ref 0.44–1.00)
GFR, Estimated: >60 mL/min (ref >60)
Glucose, Bld: 135 mg/dL — ABNORMAL HIGH (ref 70–99)
Potassium: 3.4 mmol/L — ABNORMAL LOW (ref 3.5–5.1)
Sodium: 136 mmol/L (ref 135–145)
Total Bilirubin: 0.6 mg/dL (ref 0.3–1.2)
Total Protein: 7.8 g/dL (ref 6.5–8.1)

## 2022-09-02 LAB — CBC
HCT: 42.3 % (ref 36.0–46.0)
Hemoglobin: 13.3 g/dL (ref 12.0–15.0)
MCH: 26.5 pg (ref 26.0–34.0)
MCHC: 31.4 g/dL (ref 30.0–36.0)
MCV: 84.4 fL (ref 80.0–100.0)
Platelets: 337 10*3/uL (ref 150–400)
RBC: 5.01 MIL/uL (ref 3.87–5.11)
RDW: 14.5 % (ref 11.5–15.5)
WBC: 9.9 10*3/uL (ref 4.0–10.5)
nRBC: 0 % (ref 0.0–0.2)

## 2022-09-02 LAB — LIPASE, BLOOD: Lipase: 41 U/L (ref 11–51)

## 2022-09-02 MED ORDER — KETOROLAC TROMETHAMINE 30 MG/ML IJ SOLN
30.0000 mg | Freq: Once | INTRAMUSCULAR | Status: AC
  Administered 2022-09-02: 30 mg via INTRAMUSCULAR
  Filled 2022-09-02: qty 1

## 2022-09-02 MED ORDER — KETOROLAC TROMETHAMINE 10 MG PO TABS: 10.0000 mg | ORAL_TABLET | Freq: Four times a day (QID) | ORAL | 0 refills | Status: DC | PRN

## 2022-09-02 MED ORDER — OXYCODONE-ACETAMINOPHEN 5-325 MG PO TABS: 1.0000 | ORAL_TABLET | ORAL | Status: DC | PRN

## 2022-09-02 NOTE — ED Triage Notes (Signed)
Pt to ED for continued lower abd pain, lower back pain and bilateral leg cramping for the past week. Was seen on 8/2 and dx with ovarian cyst. Denies n/v.

## 2022-09-02 NOTE — Telephone Encounter (Signed)
Pt called triage and said she has appt with [place] 09/12/22 for an ovarian cyst f/u from ER. She is currently experiencing leg pain? Could the cyst cause pain in her leg. I advised pt I do not have an answer to this qs and given weekend is here if pain is unbearable she should go to ER. She's also on her period and I advised her if heavy flow and/or severe pelvic pain happens go to ER. She also wanted to know if she will have an U/S at her appt. I advised her most likely no, the doctor will most likely do a pelvic exam, maybe some blood work and possible f/u U/S if needed.

## 2022-09-02 NOTE — ED Notes (Signed)
Ultrasound at bedside at this time.
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
## 2022-09-12 ENCOUNTER — Ambulatory Visit (INDEPENDENT_AMBULATORY_CARE_PROVIDER_SITE_OTHER): Payer: 59 | Admitting: Obstetrics & Gynecology

## 2022-09-12 ENCOUNTER — Encounter: Payer: Self-pay | Admitting: Obstetrics & Gynecology

## 2022-09-12 VITALS — BP 133/69 | HR 97 | Ht 65.0 in | Wt 356.0 lb

## 2022-09-12 DIAGNOSIS — N83201 Unspecified ovarian cyst, right side: Secondary | ICD-10-CM | POA: Diagnosis not present

## 2022-09-12 DIAGNOSIS — Z3041 Encounter for surveillance of contraceptive pills: Secondary | ICD-10-CM

## 2022-09-12 MED ORDER — NORETHINDRONE ACET-ETHINYL EST 1-20 MG-MCG PO TABS
1.0000 | ORAL_TABLET | Freq: Every day | ORAL | 3 refills | Status: DC
Start: 2022-09-12 — End: 2022-12-13
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
## 2022-12-05 ENCOUNTER — Ambulatory Visit
Admission: RE | Admit: 2022-12-05 | Discharge: 2022-12-05 | Disposition: A | Discharge status: Home / Self Care | Payer: 59 | Source: Ambulatory Visit | Attending: Obstetrics & Gynecology | Admitting: Obstetrics & Gynecology

## 2022-12-05 DIAGNOSIS — N83201 Unspecified ovarian cyst, right side: Secondary | ICD-10-CM | POA: Insufficient documentation

## 2022-12-13 ENCOUNTER — Other Ambulatory Visit (HOSPITAL_COMMUNITY)
Admission: RE | Admit: 2022-12-13 | Discharge: 2022-12-13 | Disposition: A | Discharge status: Home / Self Care | Payer: 59 | Source: Ambulatory Visit | Attending: Obstetrics & Gynecology | Admitting: Obstetrics & Gynecology

## 2022-12-13 ENCOUNTER — Ambulatory Visit (INDEPENDENT_AMBULATORY_CARE_PROVIDER_SITE_OTHER): Payer: 59 | Admitting: Obstetrics & Gynecology

## 2022-12-13 ENCOUNTER — Encounter: Payer: Self-pay | Admitting: Obstetrics & Gynecology

## 2022-12-13 VITALS — BP 132/83 | HR 114 | Ht 64.0 in | Wt 360.0 lb

## 2022-12-13 DIAGNOSIS — N926 Irregular menstruation, unspecified: Secondary | ICD-10-CM

## 2022-12-13 DIAGNOSIS — Z124 Encounter for screening for malignant neoplasm of cervix: Secondary | ICD-10-CM

## 2022-12-13 DIAGNOSIS — Z01419 Encounter for gynecological examination (general) (routine) without abnormal findings: Secondary | ICD-10-CM

## 2022-12-13 DIAGNOSIS — N83201 Unspecified ovarian cyst, right side: Secondary | ICD-10-CM

## 2022-12-13 MED ORDER — MEDROXYPROGESTERONE ACETATE 10 MG PO TABS: 10.0000 mg | ORAL_TABLET | Freq: Every day | ORAL | 5 refills | Status: DC

## 2022-12-13 NOTE — Addendum Note (Signed)
Addended by: Allie Bossier on: 12/13/2022 03:31 PM   Modules accepted: Orders
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
## 2022-12-14 LAB — CA 125: Cancer Antigen (CA) 125: 15 U/mL (ref 0.0–38.1)

## 2022-12-15 LAB — CYTOLOGY - PAP
Adequacy: ABSENT
Chlamydia: NEGATIVE
Comment: NEGATIVE
Comment: NEGATIVE
Comment: NORMAL
Diagnosis: NEGATIVE
High risk HPV: NEGATIVE
Neisseria Gonorrhea: NEGATIVE

## 2022-12-26 ENCOUNTER — Ambulatory Visit (INDEPENDENT_AMBULATORY_CARE_PROVIDER_SITE_OTHER): Payer: BC Managed Care – PPO | Admitting: Obstetrics

## 2022-12-26 ENCOUNTER — Encounter: Payer: Self-pay | Admitting: Obstetrics

## 2022-12-26 VITALS — BP 156/131 | HR 108 | Ht 64.0 in | Wt 367.0 lb

## 2022-12-26 DIAGNOSIS — Z6841 Body Mass Index (BMI) 40.0 and over, adult: Secondary | ICD-10-CM | POA: Diagnosis not present

## 2022-12-26 DIAGNOSIS — R102 Pelvic and perineal pain: Secondary | ICD-10-CM | POA: Diagnosis not present

## 2022-12-26 DIAGNOSIS — N83201 Unspecified ovarian cyst, right side: Secondary | ICD-10-CM | POA: Diagnosis not present

## 2022-12-26 DIAGNOSIS — M79604 Pain in right leg: Secondary | ICD-10-CM

## 2022-12-26 DIAGNOSIS — M545 Low back pain, unspecified: Secondary | ICD-10-CM | POA: Diagnosis not present
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
## 2023-01-10 ENCOUNTER — Ambulatory Visit (INDEPENDENT_AMBULATORY_CARE_PROVIDER_SITE_OTHER): Payer: BC Managed Care – PPO | Admitting: Obstetrics and Gynecology

## 2023-01-10 VITALS — BP 134/89 | HR 114 | Resp 16 | Ht 64.5 in | Wt 367.5 lb

## 2023-01-10 DIAGNOSIS — M5442 Lumbago with sciatica, left side: Secondary | ICD-10-CM | POA: Diagnosis not present

## 2023-01-10 DIAGNOSIS — R102 Pelvic and perineal pain: Secondary | ICD-10-CM

## 2023-01-10 DIAGNOSIS — M5441 Lumbago with sciatica, right side: Secondary | ICD-10-CM | POA: Diagnosis not present

## 2023-01-10 DIAGNOSIS — N83201 Unspecified ovarian cyst, right side: Secondary | ICD-10-CM

## 2023-01-10 DIAGNOSIS — G8929 Other chronic pain: Secondary | ICD-10-CM

## 2023-01-10 MED ORDER — NORGESTIMATE-ETH ESTRADIOL 0.25-35 MG-MCG PO TABS: 1.0000 | ORAL_TABLET | Freq: Every day | ORAL | 0 refills | Status: AC

## 2023-01-11 ENCOUNTER — Encounter: Payer: Self-pay | Admitting: Obstetrics and Gynecology

## 2023-01-26 ENCOUNTER — Ambulatory Visit: Payer: BC Managed Care – PPO | Attending: Obstetrics and Gynecology | Admitting: Physical Therapy

## 2023-01-26 DIAGNOSIS — M5441 Lumbago with sciatica, right side: Secondary | ICD-10-CM | POA: Diagnosis not present

## 2023-01-26 DIAGNOSIS — M5459 Other low back pain: Secondary | ICD-10-CM | POA: Insufficient documentation

## 2023-01-26 DIAGNOSIS — M5442 Lumbago with sciatica, left side: Secondary | ICD-10-CM | POA: Insufficient documentation

## 2023-01-26 DIAGNOSIS — G8929 Other chronic pain: Secondary | ICD-10-CM | POA: Diagnosis not present
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
## 2023-02-14 ENCOUNTER — Ambulatory Visit: Payer: BC Managed Care – PPO | Admitting: Physical Therapy

## 2023-02-14 DIAGNOSIS — M5441 Lumbago with sciatica, right side: Secondary | ICD-10-CM | POA: Diagnosis not present

## 2023-02-14 DIAGNOSIS — G8929 Other chronic pain: Secondary | ICD-10-CM | POA: Diagnosis not present

## 2023-02-14 DIAGNOSIS — M5459 Other low back pain: Secondary | ICD-10-CM

## 2023-02-14 DIAGNOSIS — M5442 Lumbago with sciatica, left side: Secondary | ICD-10-CM | POA: Diagnosis not present
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
## 2023-02-21 ENCOUNTER — Ambulatory Visit: Payer: BC Managed Care – PPO | Admitting: Physical Therapy

## 2023-02-21 DIAGNOSIS — M5459 Other low back pain: Secondary | ICD-10-CM

## 2023-02-21 DIAGNOSIS — M5442 Lumbago with sciatica, left side: Secondary | ICD-10-CM | POA: Diagnosis not present

## 2023-02-21 DIAGNOSIS — G8929 Other chronic pain: Secondary | ICD-10-CM | POA: Diagnosis not present

## 2023-02-21 DIAGNOSIS — M5441 Lumbago with sciatica, right side: Secondary | ICD-10-CM | POA: Diagnosis not present
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
## 2023-02-24 ENCOUNTER — Telehealth: Payer: Self-pay

## 2023-02-24 NOTE — Telephone Encounter (Signed)
Patient called. She wanted to be sure that you were aware that she is having some bleeding. She is not soaking a pad every hour, has had some clotting.  She wants to know if you want her to stop taking the birth control pill. I advised patient to continue taking them and take some Ibuprofen 600 mg every 6 hours. She verbalized understanding.  Please advise.

## 2023-02-27 ENCOUNTER — Ambulatory Visit: Payer: BC Managed Care – PPO | Attending: Obstetrics and Gynecology | Admitting: Physical Therapy

## 2023-02-27 DIAGNOSIS — M5459 Other low back pain: Secondary | ICD-10-CM | POA: Insufficient documentation

## 2023-02-27 NOTE — Telephone Encounter (Signed)
She can hold on the pills for this week, and then resume if she desires after next week.  She is due for a repeat ultrasound for her cyst, the order has been placed. Needs to be scheduled at the hospital.

## 2023-03-01 ENCOUNTER — Ambulatory Visit: Payer: BC Managed Care – PPO | Admitting: Physical Therapy

## 2023-03-01 ENCOUNTER — Encounter: Payer: Self-pay | Admitting: Physical Therapy

## 2023-03-01 DIAGNOSIS — M5459 Other low back pain: Secondary | ICD-10-CM | POA: Diagnosis not present
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
## 2023-03-06 ENCOUNTER — Ambulatory Visit: Payer: BC Managed Care – PPO

## 2023-03-06 ENCOUNTER — Encounter: Payer: Self-pay | Admitting: Obstetrics and Gynecology

## 2023-03-06 DIAGNOSIS — M5459 Other low back pain: Secondary | ICD-10-CM
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
## 2023-03-08 ENCOUNTER — Ambulatory Visit: Payer: BC Managed Care – PPO | Admitting: Physical Therapy

## 2023-03-13 ENCOUNTER — Encounter: Payer: BC Managed Care – PPO | Admitting: Physical Therapy

## 2023-03-16 ENCOUNTER — Ambulatory Visit: Payer: BC Managed Care – PPO | Admitting: Physical Therapy

## 2023-03-20 ENCOUNTER — Ambulatory Visit: Payer: BC Managed Care – PPO | Admitting: Physical Therapy

## 2023-03-20 DIAGNOSIS — M5459 Other low back pain: Secondary | ICD-10-CM | POA: Diagnosis not present
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
## 2023-03-22 ENCOUNTER — Ambulatory Visit: Payer: BC Managed Care – PPO | Admitting: Physical Therapy

## 2023-03-27 ENCOUNTER — Ambulatory Visit: Payer: BC Managed Care – PPO | Admitting: Physical Therapy

## 2023-03-27 DIAGNOSIS — M5459 Other low back pain: Secondary | ICD-10-CM | POA: Insufficient documentation
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
## 2023-03-29 ENCOUNTER — Ambulatory Visit: Payer: BC Managed Care – PPO | Admitting: Physical Therapy

## 2023-04-03 ENCOUNTER — Ambulatory Visit: Payer: BC Managed Care – PPO | Admitting: Physical Therapy

## 2023-04-04 ENCOUNTER — Ambulatory Visit: Attending: Obstetrics and Gynecology | Admitting: Physical Therapy

## 2023-04-04 DIAGNOSIS — M5459 Other low back pain: Secondary | ICD-10-CM
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
## 2023-08-08 ENCOUNTER — Other Ambulatory Visit: Payer: Self-pay

## 2023-08-08 ENCOUNTER — Emergency Department

## 2023-08-08 ENCOUNTER — Emergency Department
Admission: EM | Admit: 2023-08-08 | Discharge: 2023-08-08 | Disposition: A | Discharge status: Home / Self Care | Attending: Emergency Medicine | Admitting: Emergency Medicine

## 2023-08-08 DIAGNOSIS — D72829 Elevated white blood cell count, unspecified: Secondary | ICD-10-CM | POA: Insufficient documentation

## 2023-08-08 DIAGNOSIS — I10 Essential (primary) hypertension: Secondary | ICD-10-CM | POA: Diagnosis not present

## 2023-08-08 DIAGNOSIS — R0789 Other chest pain: Secondary | ICD-10-CM | POA: Diagnosis present

## 2023-08-08 LAB — HEPATIC FUNCTION PANEL
ALT: 15 U/L (ref 0–44)
AST: 20 U/L (ref 15–41)
Albumin: 3.5 g/dL (ref 3.5–5.0)
Alkaline Phosphatase: 81 U/L (ref 38–126)
Bilirubin, Direct: <0.1 mg/dL (ref 0.0–0.2)
Total Bilirubin: 0.5 mg/dL (ref 0.0–1.2)
Total Protein: 8 g/dL (ref 6.5–8.1)

## 2023-08-08 LAB — CBC
HCT: 41 % (ref 36.0–46.0)
Hemoglobin: 13.1 g/dL (ref 12.0–15.0)
MCH: 25.5 pg — ABNORMAL LOW (ref 26.0–34.0)
MCHC: 32 g/dL (ref 30.0–36.0)
MCV: 79.9 fL — ABNORMAL LOW (ref 80.0–100.0)
Platelets: 397 K/uL (ref 150–400)
RBC: 5.13 MIL/uL — ABNORMAL HIGH (ref 3.87–5.11)
RDW: 14.9 % (ref 11.5–15.5)
WBC: 14.2 K/uL — ABNORMAL HIGH (ref 4.0–10.5)
nRBC: 0 % (ref 0.0–0.2)

## 2023-08-08 LAB — POC URINE PREG, ED: Preg Test, Ur: NEGATIVE

## 2023-08-08 LAB — BASIC METABOLIC PANEL WITH GFR
Anion gap: 11 (ref 5–15)
BUN: 9 mg/dL (ref 6–20)
CO2: 27 mmol/L (ref 22–32)
Calcium: 9.4 mg/dL (ref 8.9–10.3)
Chloride: 99 mmol/L (ref 98–111)
Creatinine, Ser: 0.63 mg/dL (ref 0.44–1.00)
GFR, Estimated: >60 mL/min (ref >60)
Glucose, Bld: 192 mg/dL — ABNORMAL HIGH (ref 70–99)
Potassium: 4 mmol/L (ref 3.5–5.1)
Sodium: 137 mmol/L (ref 135–145)

## 2023-08-08 LAB — TROPONIN I (HIGH SENSITIVITY)
Troponin I (High Sensitivity): 7 ng/L (ref <18)
Troponin I (High Sensitivity): 7 ng/L (ref <18)

## 2023-08-08 LAB — D-DIMER, QUANTITATIVE: D-Dimer, Quant: <0.27 ug{FEU}/mL (ref 0.00–0.50)

## 2023-08-08 LAB — LIPASE, BLOOD: Lipase: 28 U/L (ref 11–51)

## 2023-08-08 MED ORDER — HYDROXYZINE PAMOATE 25 MG PO CAPS: 25.0000 mg | ORAL_CAPSULE | Freq: Three times a day (TID) | ORAL | 0 refills | Status: AC | PRN

## 2023-08-08 MED ORDER — AMLODIPINE BESYLATE 5 MG PO TABS: 2.5000 mg | ORAL_TABLET | Freq: Every day | ORAL | 2 refills | Status: AC

## 2023-08-08 NOTE — ED Triage Notes (Addendum)
 First nurse note: Pt to ED via POV from [facility]. Pt ambulatory to triage. Pt reports substernal CP and SOB x3 days. Pt reports intermittent tingling across chest and bilateral arms. No cardiac hx. Recent tx for anxiety. Pt started Zoloft last week.   BP 172/101 and manual 140/110 HR 113 95% RA 98.1 oral

## 2023-08-08 NOTE — ED Provider Notes (Addendum)
 [facility] Provider Note    Event Date/Time   First MD Initiated Contact with Patient 08/08/23 1153     (approximate)   History   Chest Pain   HPI  Karen Mata is a 38 y.o. female with recently diagnosed anxiety who comes in with concerns for chest pain.  Patient reports intermittent chest pain for the past 3 days.  She reports that she sometimes has some tingling sensation throughout her body all shortness of breath, palpitations.  She reports that she is not sure if this is an anxiety attack as she does get overheated with these episodes.  She denies any SI.  Denies any abdominal pain.  Thyroid testing was just checked 7 days ago and was normal Physical Exam   Triage Vital Signs: ED Triage Vitals  Encounter Vitals Group     BP 08/08/23 1035 (!) 178/120     Girls Systolic BP Percentile --      Girls Diastolic BP Percentile --      Boys Systolic BP Percentile --      Boys Diastolic BP Percentile --      Pulse Rate 08/08/23 1035 (!) 109     Resp 08/08/23 1035 (!) 23     Temp 08/08/23 1035 98.1 F (36.7 C)     Temp Source 08/08/23 1035 Oral     SpO2 08/08/23 1035 96 %     Weight 08/08/23 1033 (!) 336 lb 12.8 oz (152.8 kg)     Height 08/08/23 1033 5' 5 (1.651 m)     Head Circumference --      Peak Flow --      Pain Score 08/08/23 1032 3     Pain Loc --      Pain Education --      Exclude from Growth Chart --     Most recent vital signs: Vitals:   08/08/23 1035 08/08/23 1152  BP: (!) 178/120   Pulse: (!) 109   Resp: (!) 23   Temp: 98.1 F (36.7 C)   SpO2: 96% 96%     General: Awake, no distress.  CV:  Good peripheral perfusion.  No murmur Resp:  Normal effort.  Clear lungs Abd:  No distention.  Soft and nontender Other:  Equal radial pulses equal DP pulses.  Sensation intact No swelling in legs   ED Results / Procedures / Treatments   Labs (all labs ordered are listed, but only abnormal results are displayed) Labs Reviewed   BASIC METABOLIC PANEL WITH GFR - Abnormal; Notable for the following components:      Result Value   Glucose, Bld 192 (*)    All other components within normal limits  CBC - Abnormal; Notable for the following components:   WBC 14.2 (*)    RBC 5.13 (*)    MCV 79.9 (*)    MCH 25.5 (*)    All other components within normal limits  D-DIMER, QUANTITATIVE  HEPATIC FUNCTION PANEL  LIPASE, BLOOD  POC URINE PREG, ED  TROPONIN I (HIGH SENSITIVITY)  TROPONIN I (HIGH SENSITIVITY)     EKG  My interpretation of EKG:  Sinus tachycardia rate of 108 without any ST elevation or T wave inversions, normal intervals  RADIOLOGY I have reviewed the xray personally and interpreted no evidence of edema   PROCEDURES:  Critical Care performed: No  Procedures   MEDICATIONS ORDERED IN ED: Medications - No data to display   IMPRESSION / MDM / ASSESSMENT  AND PLAN / ED COURSE  I reviewed the triage vital signs and the nursing notes.   Patient's presentation is most consistent with acute presentation with potential threat to life or bodily function.   Differential is anxiety however consider ACS, PE.  Will proceed with troponin, D-dimer.  I have low suspicion for dissection.  She denies any numbness tingling at this time is been intermittent in nature no other signs for dissection.  Given low risk can proceed with D-dimer to rule out..  Denying any urinary symptoms  Repeat vital signs are more reassuring.  Please test is negative and she denied any urinary symptoms.  Troponins are negative x 2.  D-dimer was negative.  Hepatic function, lipase normal and she denied any abdominal pain.  Her sugars are slightly elevated and she will need to follow this up with her primary care doctor.  Discussed her elevated blood pressure and she was interested in starting a blood pressure medication given she reports that once been checked previously it has been running in the 160s.  We will start at a low dose to  see if she tolerates that.  We also discussed using some hydroxyzine  in case she is having some anxiety but upon repeat evaluation she is resting in bed stating that she feels much better she has not had any additional pain.  She feels comfortable with discharge home.  Patient understands that SSRIs can sometimes take a couple weeks to be effective she denies any SI she feels comfortable with discharge home at this time and will follow-up with her primary care doctor.  We discussed her slightly elevated sugar  The patient is on the cardiac monitor to evaluate for evidence of arrhythmia and/or significant heart rate changes.      FINAL CLINICAL IMPRESSION(S) / ED DIAGNOSES   Final diagnoses:  Atypical chest pain  Hypertension, unspecified type  Uncontrolled hypertension     Rx / DC Orders   ED Discharge Orders          Ordered    amLODipine  (NORVASC ) 5 MG tablet  Daily        08/08/23 1459    hydrOXYzine  (VISTARIL ) 25 MG capsule  3 times daily PRN        08/08/23 1459    Ambulatory referral to Cardiology        08/08/23 1459             Note:  This document was prepared using Dragon voice recognition software and may include unintentional dictation errors.   Ernest Ronal BRAVO, MD 08/08/23 1459    Ernest Ronal BRAVO, MD 08/08/23 1501    Ernest Ronal BRAVO, MD 08/08/23 1501

## 2023-08-08 NOTE — Discharge Instructions (Addendum)
 Please call use of your primary doctor to discuss your slightly elevated sugar and your blood pressure.  Try to make a follow-up appointment in the next 1 to 2 weeks to be reevaluated.  Try taking the hydroxyzine  when you develop signs feel he might be having an anxiety attack to see if this helps please return to the ER if develop worsening symptoms, fevers or any other concerns.

## 2023-09-06 ENCOUNTER — Ambulatory Visit: Admitting: Pediatrics
